# Patient Record
Sex: Female | Born: 1955 | Race: White | Hispanic: No | Marital: Single | Smoking: Never smoker
Health system: Southern US, Community
[De-identification: ages and names within clinical notes are randomized; demographics above are authoritative.]

## PROBLEM LIST (undated history)

## (undated) HISTORY — PX: NECK SURGERY: SHX720

---

## 1998-06-25 ENCOUNTER — Other Ambulatory Visit: Admission: RE | Admit: 1998-06-25 | Discharge: 1998-06-25 | Payer: Self-pay | Admitting: Obstetrics & Gynecology

## 1999-06-30 ENCOUNTER — Other Ambulatory Visit: Admission: RE | Admit: 1999-06-30 | Discharge: 1999-06-30 | Payer: Self-pay | Admitting: Obstetrics & Gynecology

## 1999-10-20 ENCOUNTER — Other Ambulatory Visit: Admission: RE | Admit: 1999-10-20 | Discharge: 1999-10-20 | Payer: Self-pay | Admitting: Obstetrics & Gynecology

## 2000-06-30 ENCOUNTER — Other Ambulatory Visit: Admission: RE | Admit: 2000-06-30 | Discharge: 2000-06-30 | Payer: Self-pay | Admitting: Obstetrics & Gynecology

## 2001-07-05 ENCOUNTER — Other Ambulatory Visit: Admission: RE | Admit: 2001-07-05 | Discharge: 2001-07-05 | Payer: Self-pay | Admitting: Obstetrics and Gynecology

## 2001-07-08 ENCOUNTER — Ambulatory Visit (HOSPITAL_COMMUNITY): Admission: RE | Admit: 2001-07-08 | Discharge: 2001-07-08 | Payer: Self-pay | Admitting: Obstetrics & Gynecology

## 2001-07-08 ENCOUNTER — Encounter: Payer: Self-pay | Admitting: Obstetrics & Gynecology

## 2002-07-11 ENCOUNTER — Other Ambulatory Visit: Admission: RE | Admit: 2002-07-11 | Discharge: 2002-07-11 | Payer: Self-pay | Admitting: Obstetrics & Gynecology

## 2003-07-18 ENCOUNTER — Other Ambulatory Visit: Admission: RE | Admit: 2003-07-18 | Discharge: 2003-07-18 | Payer: Self-pay | Admitting: Obstetrics & Gynecology

## 2004-09-03 ENCOUNTER — Other Ambulatory Visit: Admission: RE | Admit: 2004-09-03 | Discharge: 2004-09-03 | Payer: Self-pay | Admitting: Obstetrics & Gynecology

## 2006-04-13 HISTORY — PX: BREAST LUMPECTOMY: SHX2

## 2007-01-21 ENCOUNTER — Encounter: Admission: RE | Admit: 2007-01-21 | Discharge: 2007-01-21 | Payer: Self-pay | Admitting: General Surgery

## 2007-01-21 ENCOUNTER — Ambulatory Visit (HOSPITAL_BASED_OUTPATIENT_CLINIC_OR_DEPARTMENT_OTHER): Admission: RE | Admit: 2007-01-21 | Discharge: 2007-01-21 | Payer: Self-pay | Admitting: General Surgery

## 2007-01-21 ENCOUNTER — Encounter (INDEPENDENT_AMBULATORY_CARE_PROVIDER_SITE_OTHER): Payer: Self-pay | Admitting: General Surgery

## 2010-01-14 ENCOUNTER — Ambulatory Visit: Payer: Self-pay | Admitting: Internal Medicine

## 2010-08-24 ENCOUNTER — Emergency Department (HOSPITAL_COMMUNITY)
Admission: EM | Admit: 2010-08-24 | Discharge: 2010-08-24 | Disposition: A | Discharge status: Home / Self Care | Payer: 59 | Attending: Emergency Medicine | Admitting: Emergency Medicine

## 2010-08-24 ENCOUNTER — Emergency Department (HOSPITAL_COMMUNITY): Payer: 59

## 2010-08-24 DIAGNOSIS — M542 Cervicalgia: Secondary | ICD-10-CM | POA: Insufficient documentation

## 2010-08-24 DIAGNOSIS — M79609 Pain in unspecified limb: Secondary | ICD-10-CM | POA: Insufficient documentation

## 2010-08-24 DIAGNOSIS — M62838 Other muscle spasm: Secondary | ICD-10-CM | POA: Insufficient documentation

## 2010-08-24 DIAGNOSIS — M25519 Pain in unspecified shoulder: Secondary | ICD-10-CM | POA: Insufficient documentation

## 2010-08-25 ENCOUNTER — Telehealth: Payer: Self-pay | Admitting: *Deleted

## 2010-08-25 NOTE — Telephone Encounter (Signed)
Pt went to ED yesterday morning with Rt shoulder pain that radiates down arm.  Cardiac work-up and X-Rays were normal according to patient.  ED doctor prescribed Hydrocodone 5/325mg  for pain and Methocarbamol 750 mg for muscle spasms.  Pt states she is having no relief with these medications.  Wants to know if she should give it a few days or come in to see you.  Please advise.

## 2010-08-25 NOTE — Telephone Encounter (Signed)
Spoke with patient and she is going to schedule an appointment with an orthopedist to evaluate Rt shoulder pain.

## 2010-08-25 NOTE — Telephone Encounter (Signed)
Have pt call to see orthopedist of her choice. If none, she may call Trudie Reed @ [phone]

## 2010-08-26 NOTE — Op Note (Signed)
NAMEDAMESHA, [name]          ACCOUNT NO.:  [id number]   MEDICAL RECORD NO.:  [id number]          PATIENT TYPE:  AMB   LOCATION:  DSC                          FACILITY:  [facility]   PHYSICIAN:  Gabrielle Dare. Janee Morn, M.D.DATE OF BIRTH:  Sep 25, 1955   DATE OF PROCEDURE:  01/21/2007  DATE OF DISCHARGE:                               OPERATIVE REPORT   PREOPERATIVE DIAGNOSIS:  Left breast mass.   POSTOPERATIVE DIAGNOSIS:  Left breast mass.   PROCEDURE:  Needle-localized left breast biopsy.   SURGEON:  Violeta Gelinas, MD   ANESTHESIA:  General with laryngeal mask airway.   HISTORY OF PRESENT ILLNESS:  Ms. Wahid is a 55 year old female who  underwent image-guided biopsy of a suspicious area on mammogram of her  left breast.  Pathology report demonstrated fibrocystic change with a  foci of atypia, so biopsy was recommended.  She presents today for  elective needle-localized biopsy.   PROCEDURE IN DETAIL:  The patient first went to the breast center for a  needle localization.  She came over to the surgery center with her films  and her needle in place.  She was identified in the preop holding area  and her site was marked.  She received intravenous antibiotics.  She was  brought to the operating room.  Prior to prepping, her localizing needle  was trimmed to about 2 cm exiting from the skin.  After induction of  general anesthesia with laryngeal mask airway by the anesthesia staff,  her left breast was prepped and draped in a sterile fashion.  A  transverse incision was made to include the skin entrance site of the  needle.  Subcutaneous tissues were dissected down.  A core of breast  tissue was dissected out surrounding the needle and according to the  films for guidance, this was circumferentially dissected.  BOVIE cautery  was used to get excellent hemostasis.  The mass was removed in one  piece.  It was oriented with stitches for Pathology and sent for  radiology.  The wound in  the meantime was copiously irrigated.  Hemostasis was ensured.  We were notified that the specimen contained  all the suspicious area of tissue by Dr. Azucena Kuba, the radiologist.  The  wound was then closed; the subcutaneous tissues were approximated with  running 3-0 Vicryl suture and the skin was closed with a running 4-0  Monocryl subcuticular stitch.  Sponge, needle and instrument counts were  correct.  Benzoin, Steri-Strips and sterile dressings were applied.  The  patient tolerated the procedure well without apparent complication and  was taken to the recovery room in stable condition.      Gabrielle Dare Janee Morn, M.D.  Electronically Signed    BET/MEDQ  D:  01/21/2007  T:  01/22/2007  Job:  [id number]   cc:   Phone number [phone] Alice Reichert MD  Gerrit Friends. Aldona Bar, M.D.

## 2010-09-15 ENCOUNTER — Ambulatory Visit (HOSPITAL_COMMUNITY)
Admission: RE | Admit: 2010-09-15 | Discharge: 2010-09-15 | Disposition: A | Discharge status: Home / Self Care | Payer: 59 | Source: Ambulatory Visit | Attending: Orthopedic Surgery | Admitting: Orthopedic Surgery

## 2010-09-15 ENCOUNTER — Other Ambulatory Visit (HOSPITAL_COMMUNITY): Payer: Self-pay | Admitting: Orthopedic Surgery

## 2010-09-15 ENCOUNTER — Encounter (HOSPITAL_COMMUNITY)
Admission: RE | Admit: 2010-09-15 | Discharge: 2010-09-15 | Disposition: A | Discharge status: Home / Self Care | Payer: 59 | Source: Ambulatory Visit | Attending: Orthopedic Surgery | Admitting: Orthopedic Surgery

## 2010-09-15 DIAGNOSIS — Z01818 Encounter for other preprocedural examination: Secondary | ICD-10-CM | POA: Insufficient documentation

## 2010-09-15 DIAGNOSIS — M47812 Spondylosis without myelopathy or radiculopathy, cervical region: Secondary | ICD-10-CM | POA: Insufficient documentation

## 2010-09-15 DIAGNOSIS — M542 Cervicalgia: Secondary | ICD-10-CM

## 2010-09-15 DIAGNOSIS — Z01812 Encounter for preprocedural laboratory examination: Secondary | ICD-10-CM | POA: Insufficient documentation

## 2010-09-15 LAB — CBC
HCT: 40.9 % (ref 36.0–46.0)
MCHC: 32.3 g/dL (ref 30.0–36.0)
Platelets: 235 10*3/uL (ref 150–400)
RDW: 13.7 % (ref 11.5–15.5)

## 2010-09-15 LAB — SURGICAL PCR SCREEN: MRSA, PCR: NEGATIVE

## 2010-09-17 ENCOUNTER — Ambulatory Visit (HOSPITAL_COMMUNITY)
Admission: RE | Admit: 2010-09-17 | Discharge: 2010-09-18 | Disposition: A | Discharge status: Home / Self Care | Payer: 59 | Source: Ambulatory Visit | Attending: Orthopedic Surgery | Admitting: Orthopedic Surgery

## 2010-09-17 ENCOUNTER — Ambulatory Visit (HOSPITAL_COMMUNITY): Payer: 59

## 2010-09-17 DIAGNOSIS — E669 Obesity, unspecified: Secondary | ICD-10-CM | POA: Insufficient documentation

## 2010-09-17 DIAGNOSIS — R42 Dizziness and giddiness: Secondary | ICD-10-CM | POA: Insufficient documentation

## 2010-09-17 DIAGNOSIS — M5030 Other cervical disc degeneration, unspecified cervical region: Secondary | ICD-10-CM | POA: Insufficient documentation

## 2010-09-22 NOTE — Op Note (Signed)
[name], [name]          ACCOUNT NO.:  [id number]  MEDICAL RECORD NO.:  [id number]  LOCATION:  5017                         FACILITY:  [facility]  PHYSICIAN:  Alvy Beal, MD    DATE OF BIRTH:  December 17, 1955  DATE OF PROCEDURE:  09/17/2010 DATE OF DISCHARGE:                              OPERATIVE REPORT   PREOPERATIVE DIAGNOSES: 1. Advanced degenerative cervical disk disease, C5-6. 2. Acute cervical disk herniation, C6-7, left and right side.  POSTOPERATIVE DIAGNOSES: 1. Advanced degenerative cervical disk disease, C5-6. 2. Acute cervical disk herniation, C6-7, left and right side.  OPERATIVE PROCEDURE:  Anterior cervical diskectomy and fusion, C5-6, C6- 7.  COMPLICATIONS:  None.  CONDITION:  Stable.  INSTRUMENTATION SYSTEM USED:  Titan titanium intervertebral graft, 7 medium lordotic at both levels with a Synthes anterior cervical Vector plate and bone graft filler.  COMPLICATIONS:  None.  CONDITION:  Stable.  HISTORY:  This is a very pleasant 55 year old woman who presents with significant acute right arm pain, C7 radicular pain, sensory changes, and weakness.  As a result, an MRI was done which demonstrated the significant degenerative disease at C5-6 and the acute disk herniation at C6-7.  After discussing the risks, benefits, and alternatives, she elected to proceed with surgery.  OPERATIVE NOTE:  The patient was brought to the operating room, placed supine on the operating table.  After successful induction of general anesthesia and endotracheal intubation, TEDs and SCDs were applied. Rolled towels were placed between the shoulder blades.  They were then taped down to allow for radiographic evaluation and the anterior cervical spine was prepped and draped in a standard fashion. Appropriate time-out was done to confirm patient, procedure, affected extremity, and all other pertinent important data.  Once this was done, a transverse incision was made,  centered over the C6 vertebral body.  Sharp dissection was carried down to and through the platysma.  I then bluntly dissected through the deep cervical fascia, sweeping the trachea and esophagus to the right and palpating identifying and protecting the carotid sheath laterally with a finger. At this point, I could palpate the anterior cervical spine.  Thyroid retractor was placed to hold the trachea and esophagus out of the way and I used 2 Kerrison dissectors to mobilize the prevertebral fascia to completely expose the C5-6, C6-7 disk space.  There was significant degenerative changes at C5-6 level which is why elected to include this infusion.  I placed a needle into the 5/6 disk space, took an x-ray, and confirmed that I was at the appropriate level.  Once this was done and I was at the appropriate level, I then placed self-retaining retractors, deflated the endotracheal cuff, expanded the retractors, and reinflated the endotracheal cuff.  I placed distraction pins into the bodies of C6 and C7 and proceeded with diskectomy. An annulotomy was performed with the 15-blade scalpel using a combination of pituitary rongeurs, curettes, and Kerrison rongeurs.  I removed all of the disk material.  Once I had the disk material, I used a fine nerve hook to sweep on the right-hand side and was able to mobilize and remove the disk herniation.  I then resected the posterior longitudinal ligament to expose the overlying  thecal sac.  At this point, I could freely run my nerve hook underneath the endplates and realized I had an adequate decompression.  At this point, I irrigated copiously with normal saline, rasped the endplates until I had bleeding subchondral bone and then sequentially measured.  I placed a 7-medium lordotic cage, tightened, packed with autograft bone, mixed with DBX.  I attempted to use the master grafts as requested by her insurance company, however, it was very difficult to pack  the cage.  With a maintained the graft inside the cage, I elected to use the autograft bone with DBX.  I malleted this into place and had an adequate fix.  I then repositioned the distraction pins into the bodies of C5 and C6 and using the same technique, I performed a similar diskectomy.  Once I had resected all the posterior annulus and released, I then rasped, measured, and placed same size graft at this level.  I then contoured a 34-mm anterior cervical plate and applied at the anterior aspect of the vertebral body.  There was still significant slope to the C5 vertebral body.  Having resected the majority of the osteophyte, in order to properly position the plates, but I was still some distance away from the uninvolved C4-5 disk level and placed 16-mm screws which were got excellent purchase.  I then placed 14-mm screws at 6 and 7 level and all screws were properly torqued down.  X-rays were taken which were satisfactory.  At this point in time, the wound was copiously irrigated with normal saline.  Retractors were removed.  I checked for hemostasis and to ensure the trachea and the esophagus did not become entrapped underneath the plate.  Once this was done, I returned the trachea and esophagus to midline, closed the platysma with interrupted 2-0 Vicryl sutures and 3-0 Monocryl for the skin.  Steri-Strips, dry dressing were applied.  The patient was extubated, transferred to the PACU without incident.  At the end of the case, all needle and sponge counts were correct.     Alvy Beal, MD     DDB/MEDQ  D:  09/17/2010  T:  09/18/2010  Job:  [id number]  cc:   [facility]  Electronically Signed by Venita Lick MD on 09/22/2010 12:03:03 AM

## 2013-11-08 ENCOUNTER — Ambulatory Visit (INDEPENDENT_AMBULATORY_CARE_PROVIDER_SITE_OTHER): Payer: 59 | Admitting: Family Medicine

## 2013-11-08 VITALS — BP 124/86 | HR 119 | Temp 98.5°F | Resp 16 | Ht 66.5 in | Wt 247.4 lb

## 2013-11-08 DIAGNOSIS — J320 Chronic maxillary sinusitis: Secondary | ICD-10-CM

## 2013-11-08 DIAGNOSIS — J209 Acute bronchitis, unspecified: Secondary | ICD-10-CM

## 2013-11-08 MED ORDER — AZITHROMYCIN 250 MG PO TABS: ORAL_TABLET | ORAL | Status: DC

## 2013-11-08 NOTE — Patient Instructions (Signed)
Acute Bronchitis Bronchitis is inflammation of the airways that extend from the windpipe into the lungs (bronchi). The inflammation often causes mucus to develop. This leads to a cough, which is the most common symptom of bronchitis.  In acute bronchitis, the condition usually develops suddenly and goes away over time, usually in a couple weeks. Smoking, allergies, and asthma can make bronchitis worse. Repeated episodes of bronchitis may cause further lung problems.  CAUSES Acute bronchitis is most often caused by the same virus that causes a cold. The virus can spread from person to person (contagious) through coughing, sneezing, and touching contaminated objects. SIGNS AND SYMPTOMS   Cough.   Fever.   Coughing up mucus.   Body aches.   Chest congestion.   Chills.   Shortness of breath.   Sore throat.  DIAGNOSIS  Acute bronchitis is usually diagnosed through a physical exam. Your health care provider will also ask you questions about your medical history. Tests, such as chest X-rays, are sometimes done to rule out other conditions.  TREATMENT  Acute bronchitis usually goes away in a couple weeks. Oftentimes, no medical treatment is necessary. Medicines are sometimes given for relief of fever or cough. Antibiotic medicines are usually not needed but may be prescribed in certain situations. In some cases, an inhaler may be recommended to help reduce shortness of breath and control the cough. A cool mist vaporizer may also be used to help thin bronchial secretions and make it easier to clear the chest.  HOME CARE INSTRUCTIONS  Get plenty of rest.   Drink enough fluids to keep your urine clear or pale yellow (unless you have a medical condition that requires fluid restriction). Increasing fluids may help thin your respiratory secretions (sputum) and reduce chest congestion, and it will prevent dehydration.   Take medicines only as directed by your health care provider.  If  you were prescribed an antibiotic medicine, finish it all even if you start to feel better.  Avoid smoking and secondhand smoke. Exposure to cigarette smoke or irritating chemicals will make bronchitis worse. If you are a smoker, consider using nicotine gum or skin patches to help control withdrawal symptoms. Quitting smoking will help your lungs heal faster.   Reduce the chances of another bout of acute bronchitis by washing your hands frequently, avoiding people with cold symptoms, and trying not to touch your hands to your mouth, nose, or eyes.   Keep all follow-up visits as directed by your health care provider.  SEEK MEDICAL CARE IF: Your symptoms do not improve after 1 week of treatment.  SEEK IMMEDIATE MEDICAL CARE IF:  You develop an increased fever or chills.   You have chest pain.   You have severe shortness of breath.  You have bloody sputum.   You develop dehydration.  You faint or repeatedly feel like you are going to pass out.  You develop repeated vomiting.  You develop a severe headache. MAKE SURE YOU:   Understand these instructions.  Will watch your condition.  Will get help right away if you are not doing well or get worse. Document Released: 05/07/2004 Document Revised: 08/14/2013 Document Reviewed: 09/20/2012 ExitCare Patient Information 2015 [employer]. This information is not intended to replace advice given to you by your health care provider. Make sure you discuss any questions you have with your health care provider. Sinusitis Sinusitis is redness, soreness, and inflammation of the paranasal sinuses. Paranasal sinuses are air pockets within the bones of your face (beneath the   eyes, the middle of the forehead, or above the eyes). In healthy paranasal sinuses, mucus is able to drain out, and air is able to circulate through them by way of your nose. However, when your paranasal sinuses are inflamed, mucus and air can become trapped. This can  allow bacteria and other germs to grow and cause infection. Sinusitis can develop quickly and last only a short time (acute) or continue over a long period (chronic). Sinusitis that lasts for more than 12 weeks is considered chronic.  CAUSES  Causes of sinusitis include:  Allergies.  Structural abnormalities, such as displacement of the cartilage that separates your nostrils (deviated septum), which can decrease the air flow through your nose and sinuses and affect sinus drainage.  Functional abnormalities, such as when the small hairs (cilia) that line your sinuses and help remove mucus do not work properly or are not present. SIGNS AND SYMPTOMS  Symptoms of acute and chronic sinusitis are the same. The primary symptoms are pain and pressure around the affected sinuses. Other symptoms include:  Upper toothache.  Earache.  Headache.  Bad breath.  Decreased sense of smell and taste.  A cough, which worsens when you are lying flat.  Fatigue.  Fever.  Thick drainage from your nose, which often is green and may contain pus (purulent).  Swelling and warmth over the affected sinuses. DIAGNOSIS  Your health care provider will perform a physical exam. During the exam, your health care provider may:  Look in your nose for signs of abnormal growths in your nostrils (nasal polyps).  Tap over the affected sinus to check for signs of infection.  View the inside of your sinuses (endoscopy) using an imaging device that has a light attached (endoscope). If your health care provider suspects that you have chronic sinusitis, one or more of the following tests may be recommended:  Allergy tests.  Nasal culture. A sample of mucus is taken from your nose, sent to a lab, and screened for bacteria.  Nasal cytology. A sample of mucus is taken from your nose and examined by your health care provider to determine if your sinusitis is related to an allergy. TREATMENT  Most cases of acute  sinusitis are related to a viral infection and will resolve on their own within 10 days. Sometimes medicines are prescribed to help relieve symptoms (pain medicine, decongestants, nasal steroid sprays, or saline sprays).  However, for sinusitis related to a bacterial infection, your health care provider will prescribe antibiotic medicines. These are medicines that will help kill the bacteria causing the infection.  Rarely, sinusitis is caused by a fungal infection. In theses cases, your health care provider will prescribe antifungal medicine. For some cases of chronic sinusitis, surgery is needed. Generally, these are cases in which sinusitis recurs more than 3 times per year, despite other treatments. HOME CARE INSTRUCTIONS   Drink plenty of water. Water helps thin the mucus so your sinuses can drain more easily.  Use a humidifier.  Inhale steam 3 to 4 times a day (for example, sit in the bathroom with the shower running).  Apply a warm, moist washcloth to your face 3 to 4 times a day, or as directed by your health care provider.  Use saline nasal sprays to help moisten and clean your sinuses.  Take medicines only as directed by your health care provider.  If you were prescribed either an antibiotic or antifungal medicine, finish it all even if you start to feel better. SEEK IMMEDIATE MEDICAL   CARE IF:  You have increasing pain or severe headaches.  You have nausea, vomiting, or drowsiness.  You have swelling around your face.  You have vision problems.  You have a stiff neck.  You have difficulty breathing. MAKE SURE YOU:   Understand these instructions.  Will watch your condition.  Will get help right away if you are not doing well or get worse. Document Released: 03/30/2005 Document Revised: 08/14/2013 Document Reviewed: 04/14/2011 ExitCare Patient Information 2015 [employer]. This information is not intended to replace advice given to you by your health care provider.  Make sure you discuss any questions you have with your health care provider.  

## 2013-11-08 NOTE — Progress Notes (Signed)
Patient ID: Dolan AmenVirginia L Powell MRN: [id number], DOB: 04/22/1955, 58 y.o. Date of Encounter: 11/08/2013, 9:06 AM  Primary Physician: PROVIDER NOT IN SYSTEM  Chief Complaint:  Chief Complaint  Patient presents with  . URI    Since last Friday, went to [facility] and received medicine but it is not bette, coughing, sore throat    HPI: 58 y.o. year old female presents with 5 day history of nasal congestion, post nasal drip, sore throat, sinus pressure, and cough. Afebrile. No chills. Nasal congestion thick and green/yellow. Sinus pressure is the worst symptom. Cough is productive secondary to post nasal drip and not associated with time of day. Ears feel full, leading to sensation of muffled hearing. Has tried OTC cold preps without success. No GI complaints.   No recent antibiotics, recent travels, vomiting, or sick contacts   No leg trauma, sedentary periods, h/o cancer, or tobacco use.  History reviewed. No pertinent past medical history.   Home Meds: Prior to Admission medications   Medication Sig Start Date End Date Taking? Authorizing Provider  azithromycin (ZITHROMAX Z-PAK) 250 MG tablet Take as directed on pack 11/08/13   Elvina SidleKurt Breannah Kratt, MD    Allergies: Not on File  History   Social History  . Marital Status: Divorced    Spouse Name: N/A    Number of Children: N/A  . Years of Education: N/A   Occupational History  . Not on file.   Social History Main Topics  . Smoking status: Never Smoker   . Smokeless tobacco: Not on file  . Alcohol Use: No  . Drug Use: No  . Sexual Activity: Not on file   Other Topics Concern  . Not on file   Social History Narrative  . No narrative on file     Review of Systems: Constitutional: negative for chills, fever, night sweats or weight changes Cardiovascular: negative for chest pain or palpitations Respiratory: negative for hemoptysis, wheezing, or shortness of breath Abdominal: negative for abdominal pain, nausea,  vomiting or diarrhea Dermatological: negative for rash Neurologic: negative for headache   Physical Exam: Blood pressure 124/86, pulse 119, temperature 98.5 F (36.9 C), resp. rate 16, height 5' 6.5" (1.689 m), weight 247 lb 6.4 oz (112.22 kg), SpO2 95.00%., Body mass index is 39.34 kg/(m^2). General: Well developed, well nourished, in no acute distress. Head: Normocephalic, atraumatic, eyes without discharge, sclera non-icteric, nares are congested. Bilateral auditory canals clear, TM's are without perforation, pearly grey on right with red left malleus and with reflective cone of light bilaterally. Serous effusion bilaterally behind TM's. Maxillary sinus TTP. Oral cavity moist, dentition normal. Posterior pharynx with post nasal drip and mild erythema. No peritonsillar abscess or tonsillar exudate. Neck: Supple. No thyromegaly. Full ROM. No lymphadenopathy. Lungs: Clear bilaterally to auscultation without wheezes, rales, or rhonchi. Breathing is unlabored.  Heart: RRR with S1 S2. No murmurs, rubs, or gallops appreciated. Msk:  Strength and tone normal for age. Extremities: No clubbing or cyanosis. No edema. Neuro: Alert and oriented X 3. Moves all extremities spontaneously. CNII-XII grossly in tact. Psych:  Responds to questions appropriately with a normal affect.   Labs:   ASSESSMENT AND PLAN:  58 y.o. year old female with sinusitis Maxillary sinusitis, unspecified chronicity - Plan: azithromycin (ZITHROMAX Z-PAK) 250 MG tablet  Acute bronchitis, unspecified organism - Plan: azithromycin (ZITHROMAX Z-PAK) 250 MG tablet   -  -Tylenol/Motrin prn -Rest/fluids -RTC precautions -RTC 3-5 days if no improvement  Signed, Elvina SidleKurt Elvis Laufer, MD 11/08/2013 9:06 AM

## 2015-09-11 ENCOUNTER — Ambulatory Visit (INDEPENDENT_AMBULATORY_CARE_PROVIDER_SITE_OTHER): Payer: 59 | Admitting: Family Medicine

## 2015-09-11 VITALS — BP 126/80 | HR 100 | Temp 97.6°F | Resp 18 | Ht 66.5 in | Wt 256.0 lb

## 2015-09-11 DIAGNOSIS — H8111 Benign paroxysmal vertigo, right ear: Secondary | ICD-10-CM

## 2015-09-11 MED ORDER — MECLIZINE HCL 25 MG PO TABS: 25.0000 mg | ORAL_TABLET | Freq: Three times a day (TID) | ORAL | Status: AC | PRN

## 2015-09-11 MED ORDER — ALPRAZOLAM 0.5 MG PO TABS: 0.2500 mg | ORAL_TABLET | Freq: Two times a day (BID) | ORAL | Status: AC | PRN

## 2015-09-11 NOTE — Progress Notes (Signed)
   HPI  Patient presents today here with her typical vertigo attack  Pt describes room spinning sensation that started this am when she woke up. Symptoms are worse with quick rightward gaze. She has no weakness, numbness, or tingling.   She usually takes xanax fo rthis, her 1 remaining pill this am was very old so she isnt sure if that is why it did not work.   She has been seen by ENT for this an given a handout for epleys maneuvers but never tried them.   She is improved slightly from this am but is still very hesistant to test it in clinic  PMH: Smoking status noted ROS: Per HPI  Objective: BP 126/80 mmHg  Pulse 100  Temp(Src) 97.6 F (36.4 C) (Oral)  Resp 18  Ht 5' 6.5" (1.689 m)  Wt 256 lb (116.121 kg)  BMI 40.71 kg/m2  SpO2 96% Gen: NAD, alert, cooperative with exam HEENT: NCAT, EOMI, PERRL, TMS WNL BL CV: RRR, good S1/S2, no murmur Resp: CTABL, no wheezes, non-labored Ext: No edema, warm Neuro: Alert and oriented, strength 5/5 and sensation intact in BL upper and lower extremities, CN 2-12 intact.   Assessment and plan:  # BPPV History consistent with BPPV, neuro exam reassuring  Given small amount of xanax, also discussed mecliine Discussed epleyes maneuvers in depth, video reviewed and handout reviewed,.  RTC with any worsening or failure to improve.     Meds ordered this encounter  Medications  . ALPRAZolam (XANAX) 0.5 MG tablet    Sig: Take 0.5-1 tablets (0.25-0.5 mg total) by mouth 2 (two) times daily as needed (dizziness).    Dispense:  10 tablet    Refill:  0  . meclizine (ANTIVERT) 25 MG tablet    Sig: Take 1 tablet (25 mg total) by mouth 3 (three) times daily as needed for dizziness.    Dispense:  30 tablet    Refill:  0    Kevin FentonSamuel Laylynn Campanella, MD 2:22 PM

## 2015-09-11 NOTE — Patient Instructions (Addendum)
Great to meet you!  Definitely try the exercises, these will be very helpful for you.   Try meclizine as well, this may help the dizziness without making you as sleepy. It can cause sleepiness.    IF you received an x-ray today, you will receive an invoice from [facility]. Please contact [facility] at [phone] with questions or concerns regarding your invoice.   IF you received labwork today, you will receive an invoice from [employer]. Please contact [facility] at [phone] with questions or concerns regarding your invoice.   Our billing staff will not be able to assist you with questions regarding bills from these companies.  You will be contacted with the lab results as soon as they are available. The fastest way to get your results is to activate your My Chart account. Instructions are located on the last page of this paperwork. If you have not heard from [place] regarding the results in 2 weeks, please contact this office.     We recommend that you schedule a mammogram for breast cancer screening. Typically, you do not need a referral to do this. Please contact a local imaging center to schedule your mammogram.  [facility] - [phone]  *ask for the Radiology Department [facility]) - [phone] or [phone]  [facility] - [phone] [facility] - [phone] [facility] - [phone]  *ask for the Radiology Department [facility] - [phone]  *ask for the Radiology Department [facility] - [phone]  *ask for the Mammography Department [facility] - [phone]   Benign Positional Vertigo Vertigo is the feeling that you or your surroundings are moving when they are not. Benign positional vertigo is the most common form of vertigo. The cause of this condition is not serious (is benign). This condition is  triggered by certain movements and positions (is positional). This condition can be dangerous if it occurs while you are doing something that could endanger you or others, such as driving.  CAUSES In many cases, the cause of this condition is not known. It may be caused by a disturbance in an area of the inner ear that helps your brain to sense movement and balance. This disturbance can be caused by a viral infection (labyrinthitis), head injury, or repetitive motion. RISK FACTORS This condition is more likely to develop in:  Women.  People who are 75 years of age or older. SYMPTOMS Symptoms of this condition usually happen when you move your head or your eyes in different directions. Symptoms may start suddenly, and they usually last for less than a minute. Symptoms may include:  Loss of balance and falling.  Feeling like you are spinning or moving.  Feeling like your surroundings are spinning or moving.  Nausea and vomiting.  Blurred vision.  Dizziness.  Involuntary eye movement (nystagmus). Symptoms can be mild and cause only slight annoyance, or they can be severe and interfere with daily life. Episodes of benign positional vertigo may return (recur) over time, and they may be triggered by certain movements. Symptoms may improve over time. DIAGNOSIS This condition is usually diagnosed by medical history and a physical exam of the head, neck, and ears. You may be referred to a health care provider who specializes in ear, nose, and throat (ENT) problems (otolaryngologist) or a provider who specializes in disorders of the nervous system (neurologist). You may have additional testing, including:  MRI.  A CT scan.  Eye movement tests. Your health care provider may ask you to change positions quickly while he or she watches you for symptoms of benign positional vertigo, such as nystagmus. Eye movement may be tested with an electronystagmogram (ENG), caloric stimulation, the  Dix-Hallpike test, or the roll test.  An electroencephalogram (EEG). This records electrical activity in your brain.  Hearing tests. TREATMENT Usually, your health care provider will treat this by moving your head in specific positions to adjust your inner ear back to normal. Surgery may be needed in severe cases, but this is rare. In some cases, benign positional vertigo may resolve on its own in 2-4 weeks. HOME CARE INSTRUCTIONS Safety  Move slowly.Avoid sudden body or head movements.  Avoid driving.  Avoid operating heavy machinery.  Avoid doing any tasks that would be dangerous to you or others if a vertigo episode would occur.  If you have trouble walking or keeping your balance, try using a cane for stability. If you feel dizzy or unstable, sit down right away.  Return to your normal activities as told by your health care provider. Ask your health care provider what activities are safe for you. General Instructions  Take over-the-counter and prescription medicines only as told by your health care provider.  Avoid certain positions or movements as told by your health care provider.  Drink enough fluid to keep your urine clear or pale yellow.  Keep all follow-up visits as told by your health care provider. This is important. SEEK MEDICAL CARE IF:  You have a fever.  Your condition gets worse or you develop new symptoms.  Your family or friends notice any behavioral changes.  Your nausea or vomiting gets worse.  You have numbness or a "pins and needles" sensation. SEEK IMMEDIATE MEDICAL CARE IF:  You have difficulty speaking or moving.  You are always dizzy.  You faint.  You develop severe headaches.  You have weakness in your legs or arms.  You have changes in your hearing or vision.  You develop a stiff neck.  You develop sensitivity to light.   This information is not intended to replace advice given to you by your health care provider. Make sure you  discuss any questions you have with your health care provider.   Document Released: 01/05/2006 Document Revised: 12/19/2014 Document Reviewed: 07/23/2014 Elsevier Interactive Patient Education [employer]2016 [employer]

## 2020-01-16 ENCOUNTER — Other Ambulatory Visit: Payer: Self-pay | Admitting: Obstetrics & Gynecology

## 2020-01-16 DIAGNOSIS — N63 Unspecified lump in unspecified breast: Secondary | ICD-10-CM

## 2020-01-18 ENCOUNTER — Other Ambulatory Visit: Payer: Self-pay

## 2020-01-18 ENCOUNTER — Other Ambulatory Visit: Payer: Self-pay | Admitting: Obstetrics & Gynecology

## 2020-01-18 ENCOUNTER — Ambulatory Visit
Admission: RE | Admit: 2020-01-18 | Discharge: 2020-01-18 | Disposition: A | Discharge status: Home / Self Care | Payer: Self-pay | Source: Ambulatory Visit | Attending: Obstetrics & Gynecology | Admitting: Obstetrics & Gynecology

## 2020-01-18 DIAGNOSIS — N63 Unspecified lump in unspecified breast: Secondary | ICD-10-CM

## 2020-04-22 ENCOUNTER — Other Ambulatory Visit: Payer: Self-pay

## 2020-06-19 ENCOUNTER — Other Ambulatory Visit: Payer: Self-pay

## 2020-06-19 ENCOUNTER — Ambulatory Visit
Admission: RE | Admit: 2020-06-19 | Discharge: 2020-06-19 | Disposition: A | Discharge status: Home / Self Care | Payer: Medicare Other | Source: Ambulatory Visit | Attending: Obstetrics & Gynecology | Admitting: Obstetrics & Gynecology

## 2020-06-19 DIAGNOSIS — N63 Unspecified lump in unspecified breast: Secondary | ICD-10-CM

## 2021-07-05 IMAGING — US US BREAST*L* LIMITED INC AXILLA
1 series · 6 of 6 positions shown · non-contrast
Comparison: Previous exam(s).

ACR Breast Density Category a: The breast tissue is almost entirely
fatty.

CLINICAL DATA: Palpable abnormality in the LEFT breast, first noted
approximately 2 weeks ago. Mass was initially associated with
visible bruise. The bruise has resolved but the mass remains.
Patient recalls no trauma.

EXAM:
DIGITAL DIAGNOSTIC BILATERAL MAMMOGRAM WITH CAD AND TOMO
ULTRASOUND LEFT BREAST

[Series 1: us breast*left* limited inc axilla · 0.06mm/px · 6 of 6 slices shown]
[im 1/6]
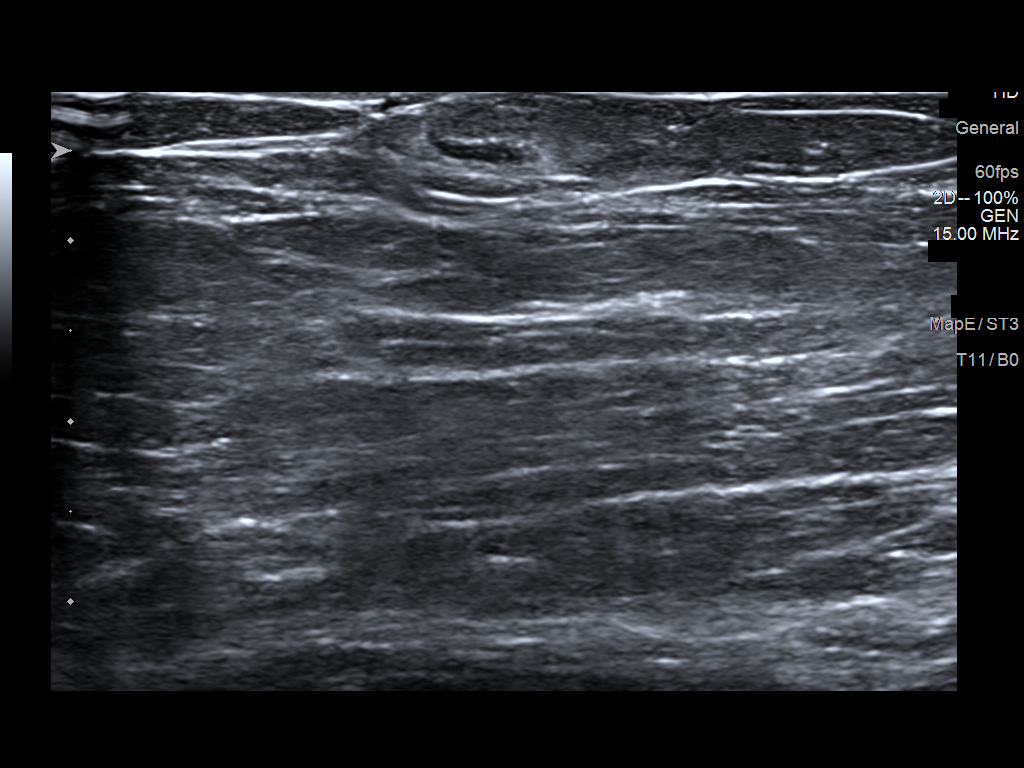
[im 2/6]
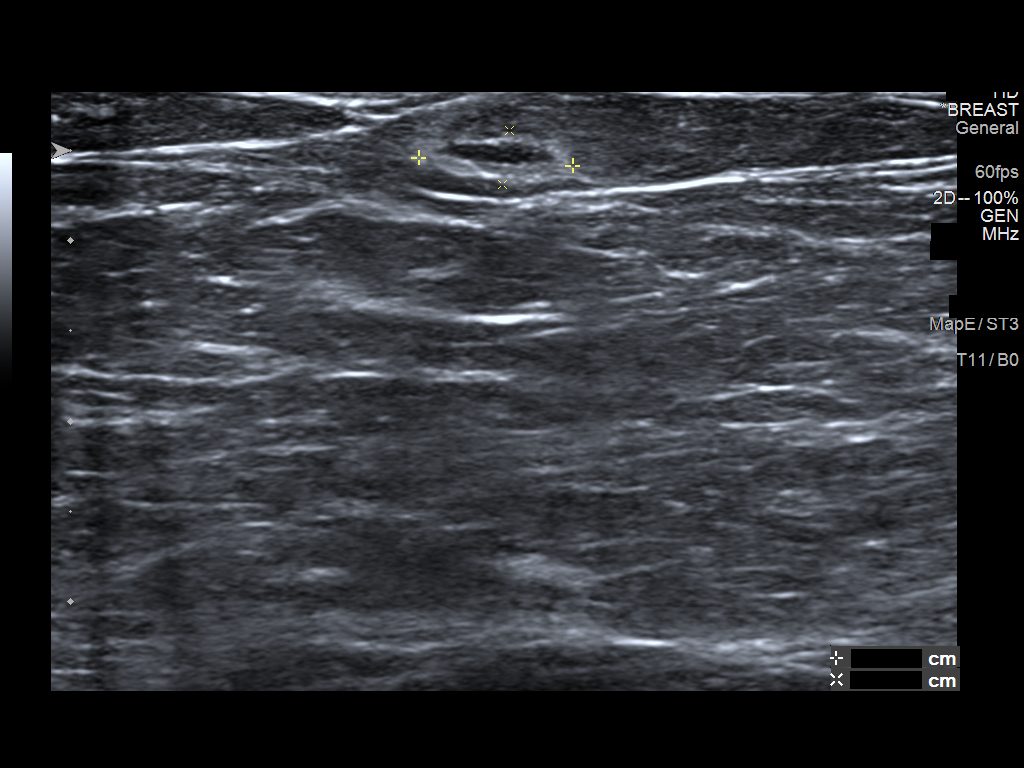
[im 3/6]
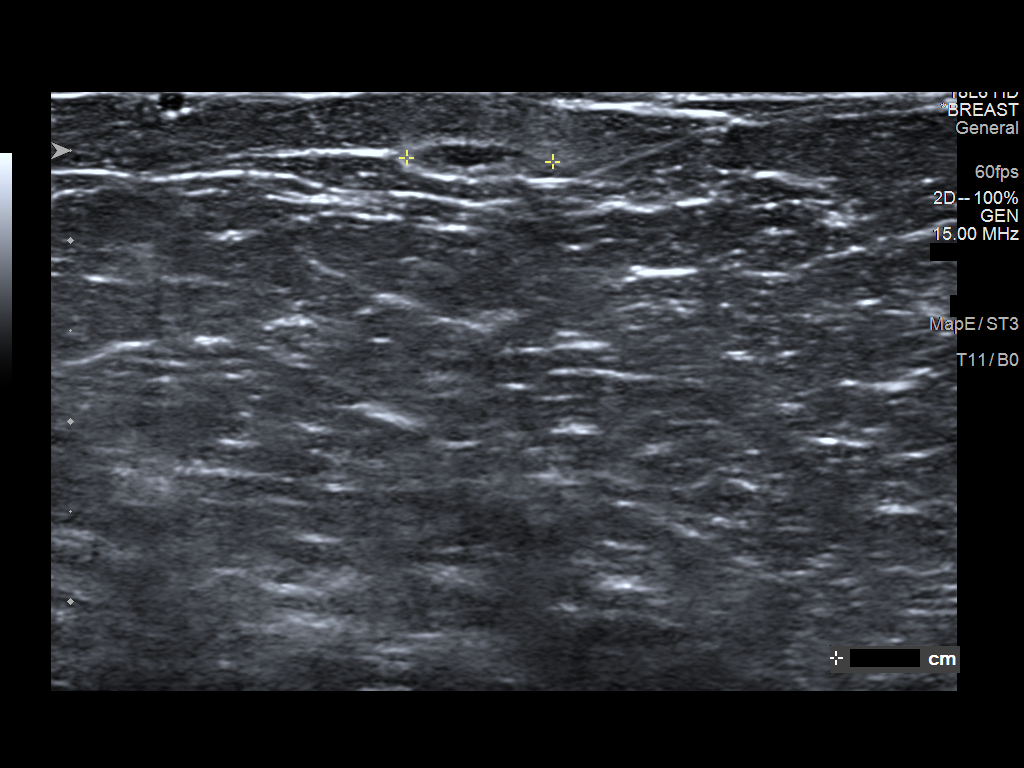
[im 4/6]
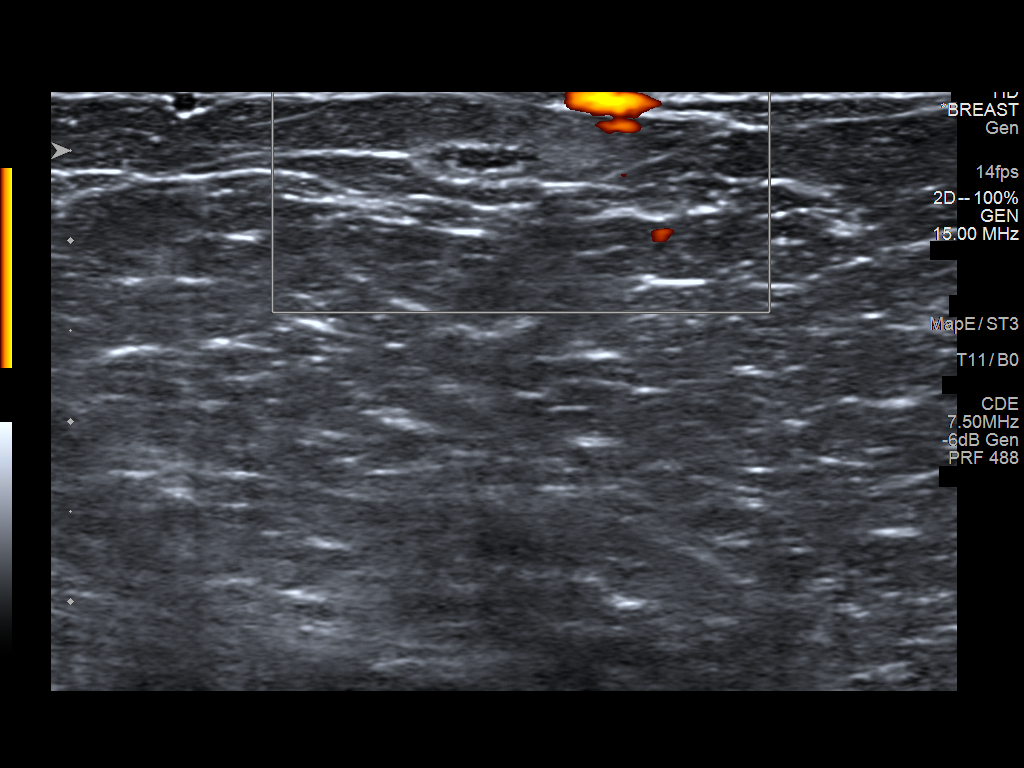
[im 5/6]
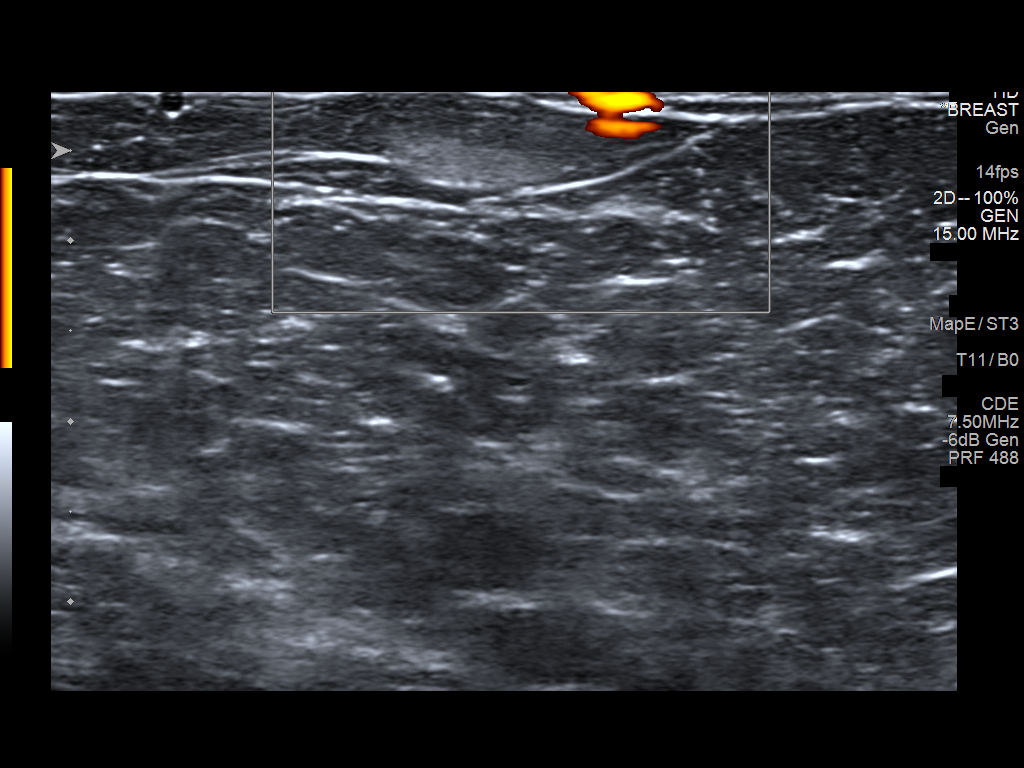
[im 6/6]
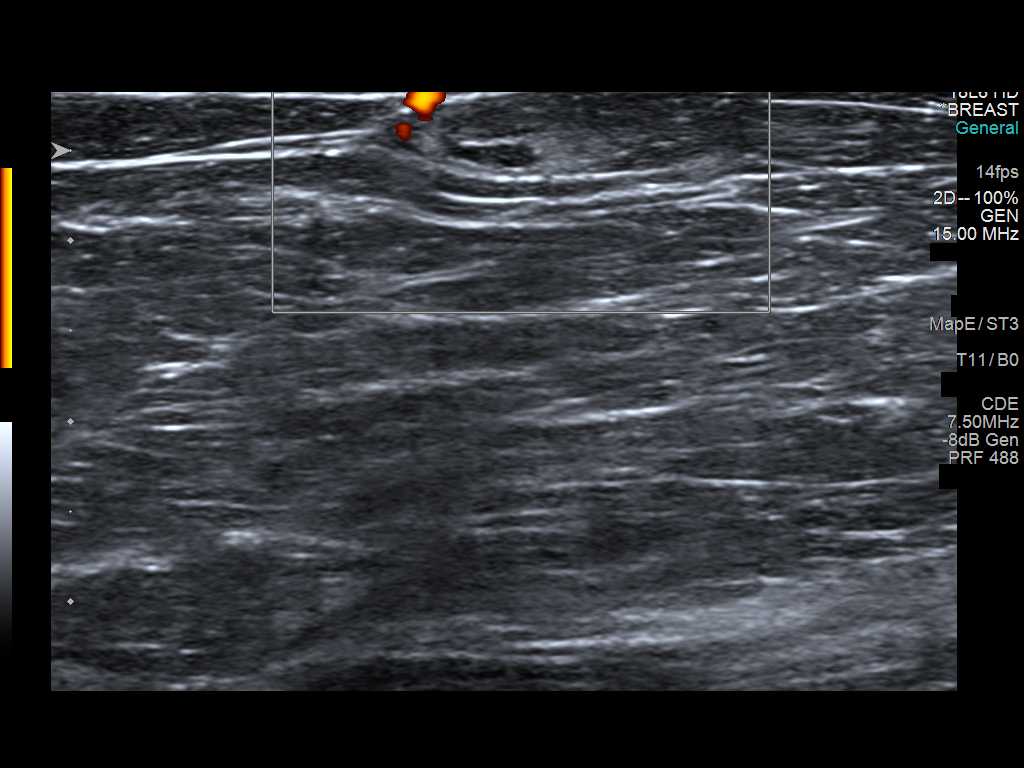

[6 of 6 positions shown; findings below may reference images not displayed]

FINDINGS: BB marks area of patient's concern and corresponds to a superficial
oval mass associated with fat lucency. Mass measures approximately 7
millimeters in diameter.

RIGHT breast is negative.

Mammographic images were processed with CAD.

On physical exam, I palpate a superficial discrete round mass in the
10 o'clock location of the LEFT breast 5 centimeters from the
nipple, in the area of concern. There is no visible ecchymosis.

Targeted ultrasound is performed, showing a mixed echogenicity
lesion in the 10 o'clock location of the LEFT breast 5 centimeters
from the nipple which measures 0.9 x 0.3 x 0.8 centimeters. No
internal blood flow identified by Doppler evaluation.
IMPRESSION: Findings are consistent benign fat necrosis in the LEFT breast. No
mammographic or ultrasound evidence for malignancy.

RECOMMENDATION:
Recommend LEFT breast ultrasound in 3 months to assess
stability/resolution of these changes.

I have discussed the findings and recommendations with the patient.
If applicable, a reminder letter will be sent to the patient
regarding the next appointment.

BI-RADS CATEGORY  3: Probably benign.

## 2021-07-05 IMAGING — MG DIGITAL DIAGNOSTIC BILAT W/ TOMO W/ CAD
6 of 10 series · 6 of 30 positions shown · non-contrast
Comparison: Previous exam(s).

ACR Breast Density Category a: The breast tissue is almost entirely
fatty.

CLINICAL DATA: Palpable abnormality in the LEFT breast, first noted
approximately 2 weeks ago. Mass was initially associated with
visible bruise. The bruise has resolved but the mass remains.
Patient recalls no trauma.

EXAM:
DIGITAL DIAGNOSTIC BILATERAL MAMMOGRAM WITH CAD AND TOMO
ULTRASOUND LEFT BREAST

[R CC synth-2D]
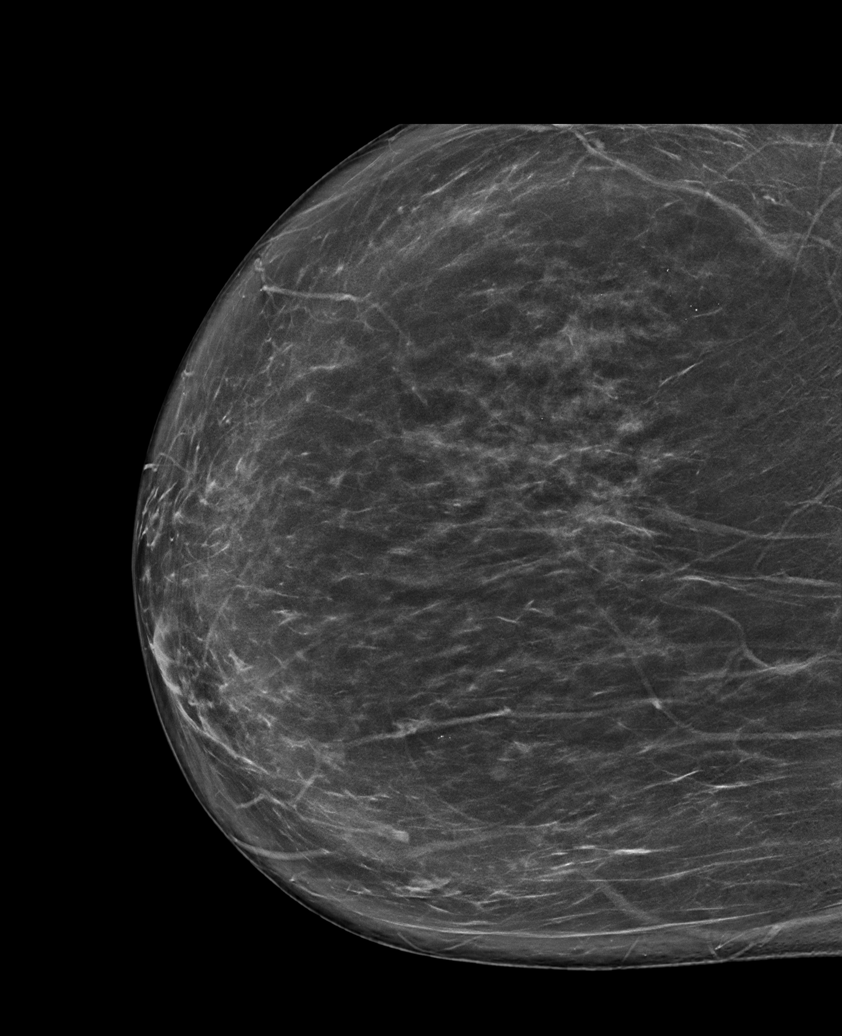

[L MLO synth-2D]
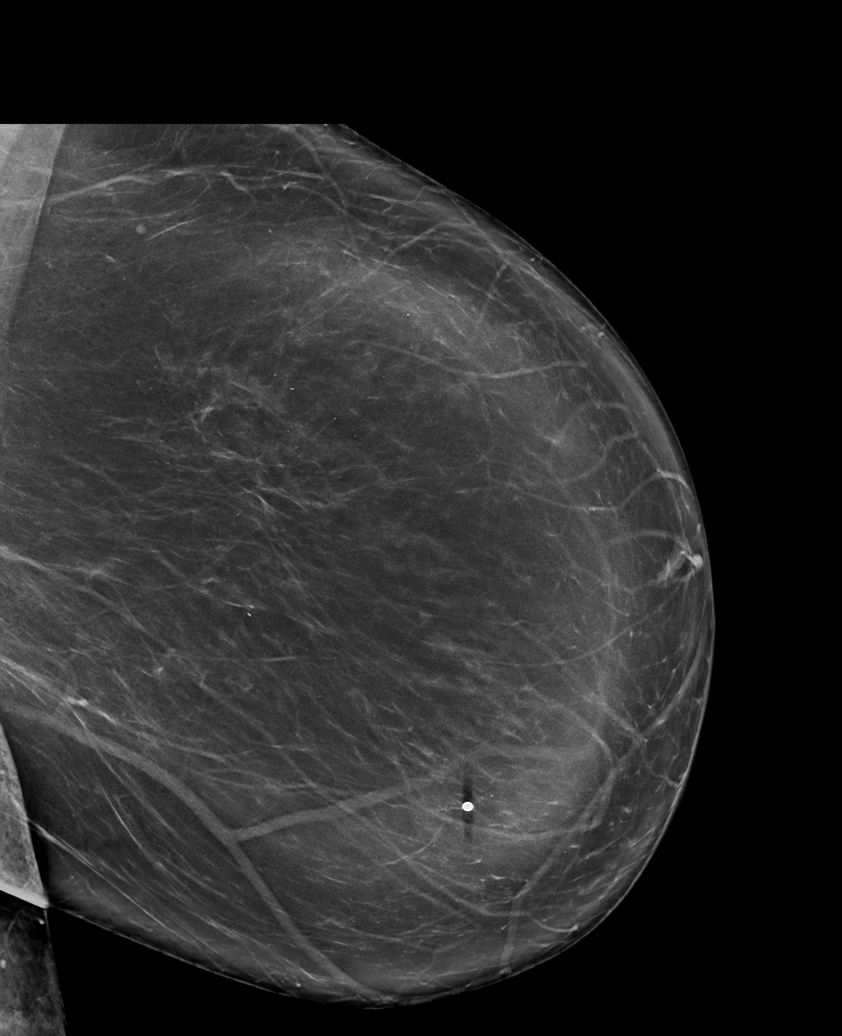

[R MLO synth-2D]
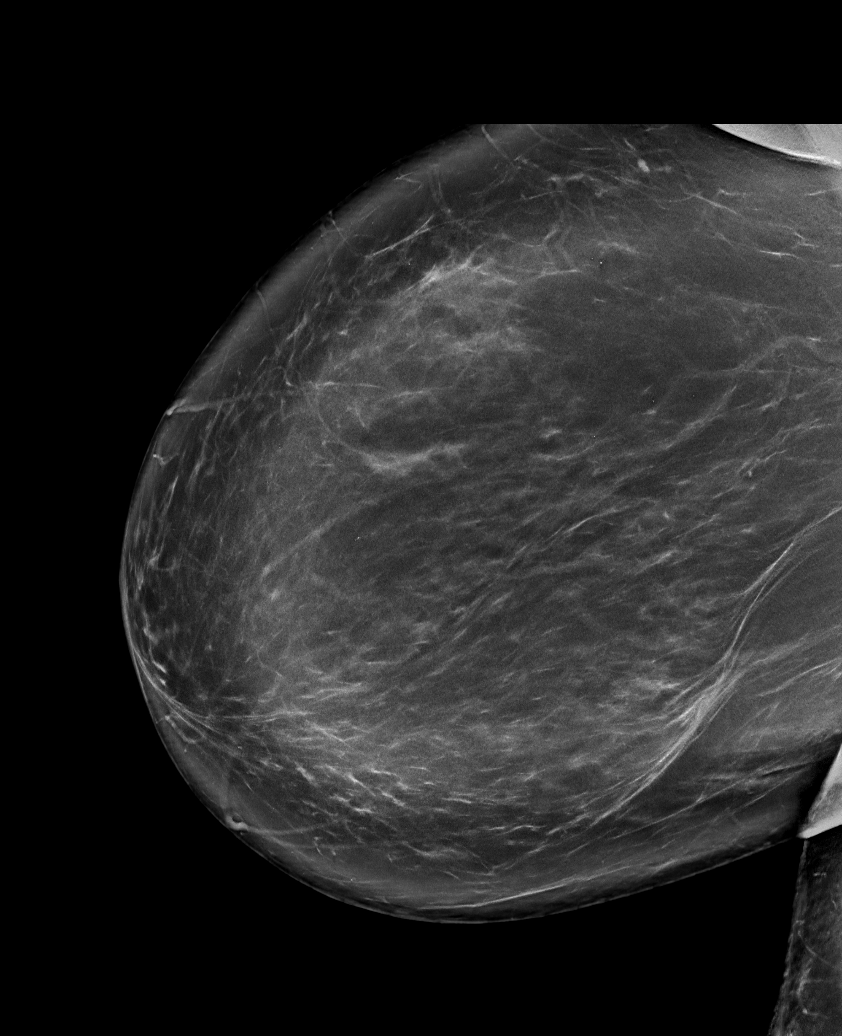

[L TAN synth-2D]
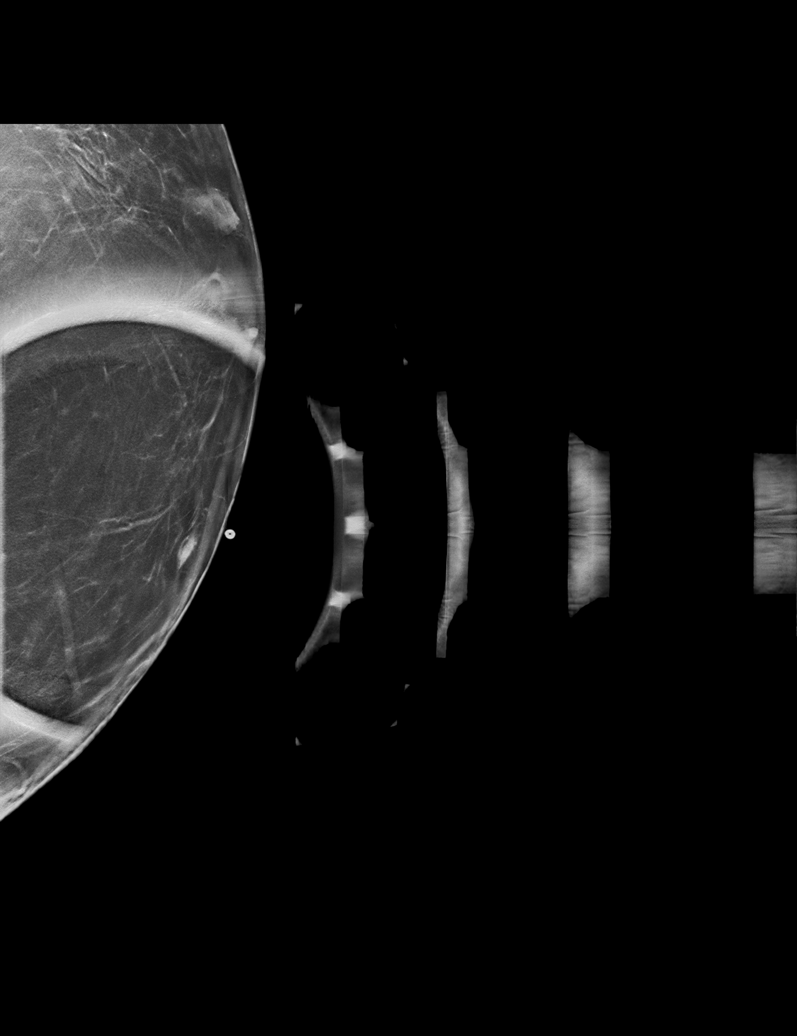

[L CC synth-2D]
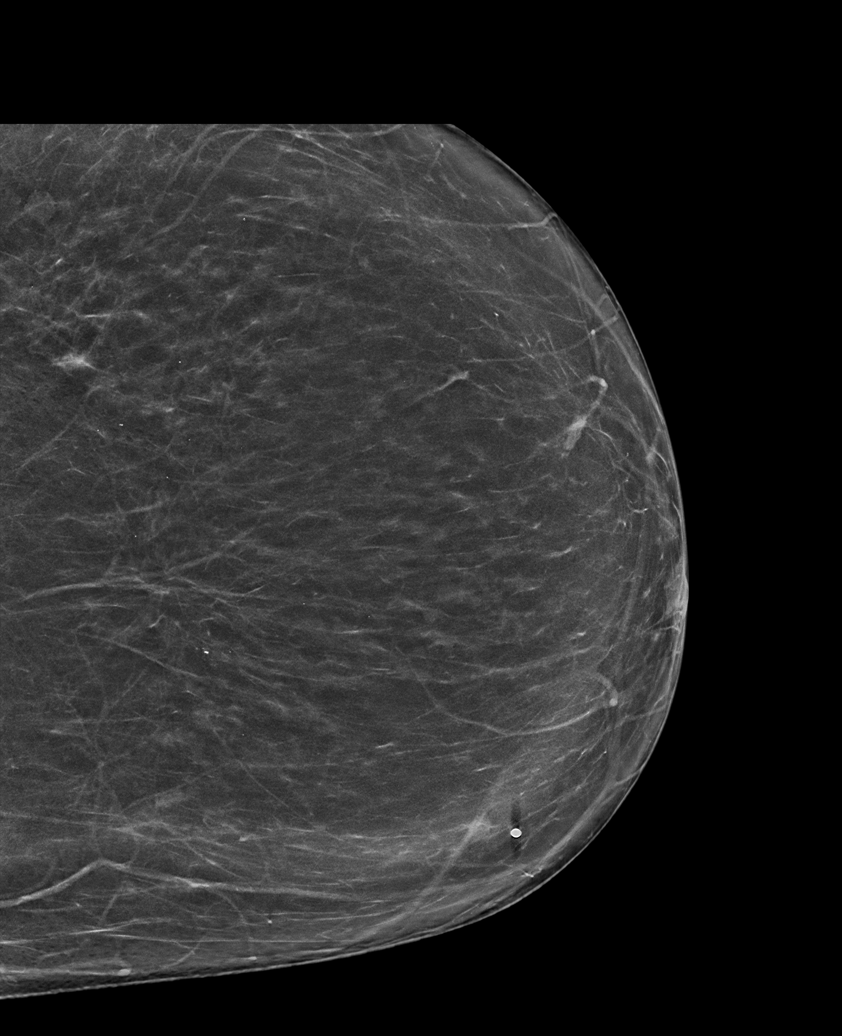

[L TAN tomo · tomo slice 32/63.0]
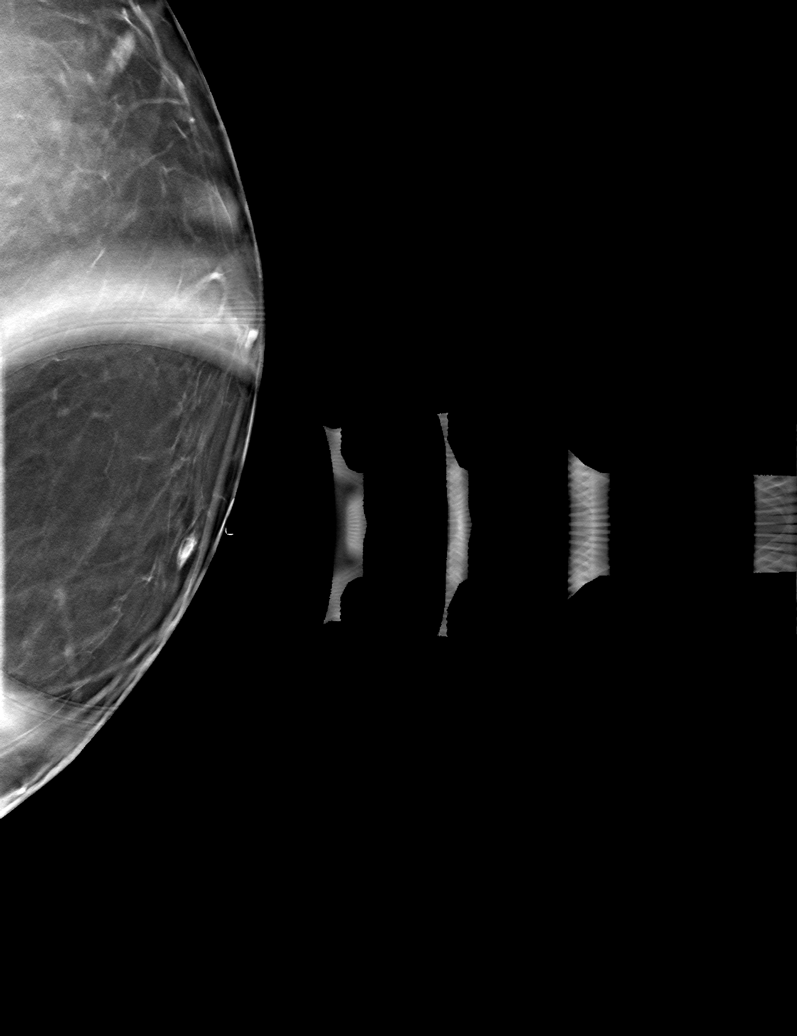

[6 of 30 positions shown; findings below may reference images not displayed]

FINDINGS: BB marks area of patient's concern and corresponds to a superficial
oval mass associated with fat lucency. Mass measures approximately 7
millimeters in diameter.

RIGHT breast is negative.

Mammographic images were processed with CAD.

On physical exam, I palpate a superficial discrete round mass in the
10 o'clock location of the LEFT breast 5 centimeters from the
nipple, in the area of concern. There is no visible ecchymosis.

Targeted ultrasound is performed, showing a mixed echogenicity
lesion in the 10 o'clock location of the LEFT breast 5 centimeters
from the nipple which measures 0.9 x 0.3 x 0.8 centimeters. No
internal blood flow identified by Doppler evaluation.
IMPRESSION: Findings are consistent benign fat necrosis in the LEFT breast. No
mammographic or ultrasound evidence for malignancy.

RECOMMENDATION:
Recommend LEFT breast ultrasound in 3 months to assess
stability/resolution of these changes.

I have discussed the findings and recommendations with the patient.
If applicable, a reminder letter will be sent to the patient
regarding the next appointment.

BI-RADS CATEGORY  3: Probably benign.

## 2021-11-28 DIAGNOSIS — R7303 Prediabetes: Secondary | ICD-10-CM | POA: Diagnosis not present

## 2021-11-28 DIAGNOSIS — Z0000 Encounter for general adult medical examination without abnormal findings: Secondary | ICD-10-CM | POA: Diagnosis not present

## 2021-11-28 DIAGNOSIS — E559 Vitamin D deficiency, unspecified: Secondary | ICD-10-CM | POA: Diagnosis not present

## 2021-11-28 DIAGNOSIS — R42 Dizziness and giddiness: Secondary | ICD-10-CM | POA: Diagnosis not present

## 2021-11-28 DIAGNOSIS — E785 Hyperlipidemia, unspecified: Secondary | ICD-10-CM | POA: Diagnosis not present

## 2021-11-28 DIAGNOSIS — Z1211 Encounter for screening for malignant neoplasm of colon: Secondary | ICD-10-CM | POA: Diagnosis not present

## 2021-12-02 ENCOUNTER — Other Ambulatory Visit: Payer: Self-pay | Admitting: Family Medicine

## 2021-12-02 DIAGNOSIS — E2839 Other primary ovarian failure: Secondary | ICD-10-CM

## 2021-12-03 ENCOUNTER — Other Ambulatory Visit: Payer: Medicare Other

## 2021-12-05 IMAGING — US US BREAST*L* LIMITED INC AXILLA
1 series · 2 of 2 positions shown · non-contrast
Comparison: Previous exams including LEFT breast diagnostic
mammogram and ultrasound dated 01/18/2020.

CLINICAL DATA: Follow-up for probably benign fat necrosis within
the LEFT breast.

EXAM:
ULTRASOUND OF THE LEFT BREAST

[Series 1: us breast*left* limited inc axilla · 0.07mm/px · 2 of 2 slices shown]
[im 1/2]
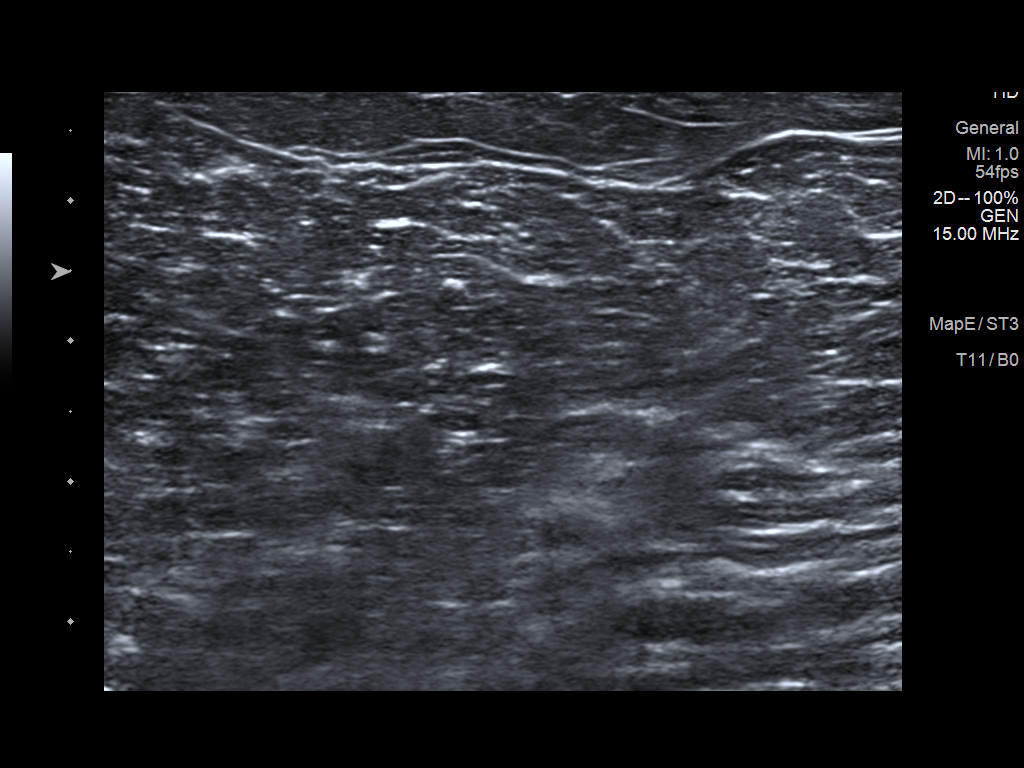
[im 2/2]
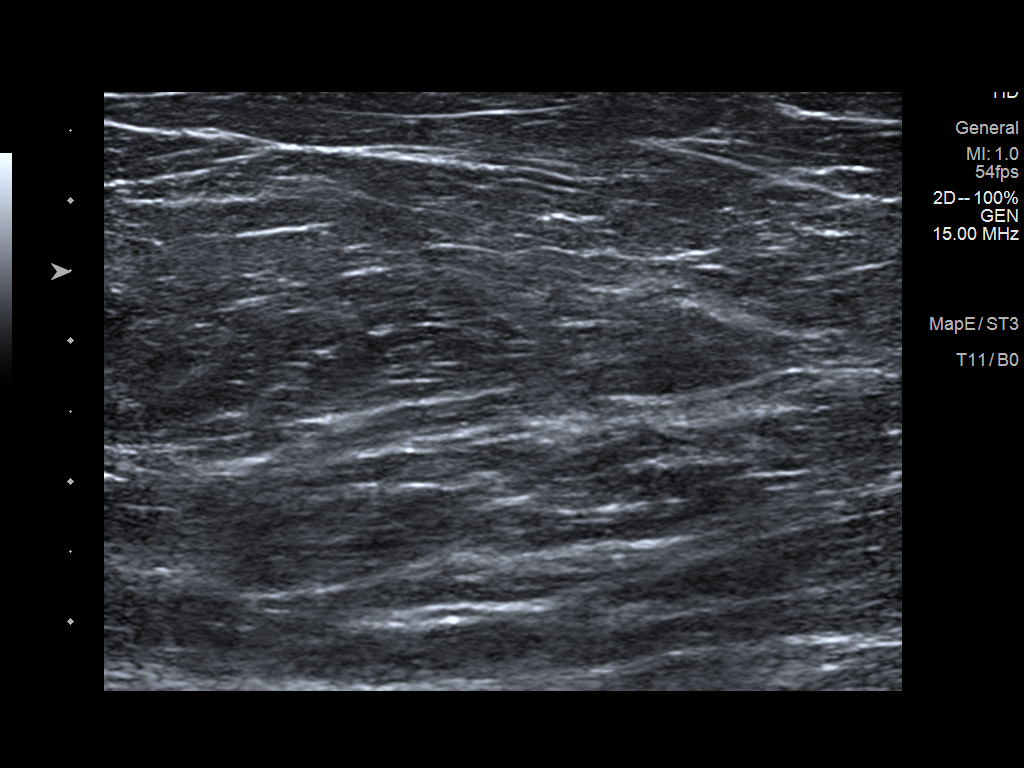

[2 of 2 positions shown; findings below may reference images not displayed]

FINDINGS: Targeted ultrasound is performed, showing an interval resolution of
the previously demonstrated LEFT breast mass, confirming benign fat
necrosis.
IMPRESSION: No evidence of malignancy. Resolved fat necrosis within the LEFT
breast.

Patient may now return to a routine annual bilateral screening
mammogram schedule. Next bilateral screening mammogram will be due
in Monday January, 2021.

RECOMMENDATION:
Bilateral screening mammogram in Monday January, 2021.

I have discussed the findings and recommendations with the patient.
If applicable, a reminder letter will be sent to the patient
regarding the next appointment.

BI-RADS CATEGORY  1: Negative.

## 2021-12-16 DIAGNOSIS — Z1211 Encounter for screening for malignant neoplasm of colon: Secondary | ICD-10-CM | POA: Diagnosis not present

## 2021-12-26 ENCOUNTER — Other Ambulatory Visit: Payer: Self-pay | Admitting: Family Medicine

## 2021-12-26 DIAGNOSIS — Z1231 Encounter for screening mammogram for malignant neoplasm of breast: Secondary | ICD-10-CM

## 2022-03-04 DIAGNOSIS — E785 Hyperlipidemia, unspecified: Secondary | ICD-10-CM | POA: Diagnosis not present

## 2022-05-18 ENCOUNTER — Ambulatory Visit
Admission: RE | Admit: 2022-05-18 | Discharge: 2022-05-18 | Disposition: A | Discharge status: Home / Self Care | Payer: Medicare Other | Source: Ambulatory Visit | Attending: Family Medicine | Admitting: Family Medicine

## 2022-05-18 DIAGNOSIS — E2839 Other primary ovarian failure: Secondary | ICD-10-CM

## 2022-05-18 DIAGNOSIS — Z1231 Encounter for screening mammogram for malignant neoplasm of breast: Secondary | ICD-10-CM | POA: Diagnosis not present

## 2022-05-18 DIAGNOSIS — Z780 Asymptomatic menopausal state: Secondary | ICD-10-CM | POA: Diagnosis not present

## 2022-05-21 ENCOUNTER — Other Ambulatory Visit: Payer: Self-pay | Admitting: Family Medicine

## 2022-05-21 DIAGNOSIS — R928 Other abnormal and inconclusive findings on diagnostic imaging of breast: Secondary | ICD-10-CM

## 2022-06-01 ENCOUNTER — Ambulatory Visit
Admission: RE | Admit: 2022-06-01 | Discharge: 2022-06-01 | Disposition: A | Discharge status: Home / Self Care | Payer: Medicare Other | Source: Ambulatory Visit | Attending: Family Medicine | Admitting: Family Medicine

## 2022-06-01 DIAGNOSIS — R928 Other abnormal and inconclusive findings on diagnostic imaging of breast: Secondary | ICD-10-CM | POA: Diagnosis not present

## 2022-06-01 DIAGNOSIS — R921 Mammographic calcification found on diagnostic imaging of breast: Secondary | ICD-10-CM | POA: Diagnosis not present

## 2022-06-02 ENCOUNTER — Other Ambulatory Visit: Payer: Self-pay | Admitting: Family Medicine

## 2022-06-02 DIAGNOSIS — R921 Mammographic calcification found on diagnostic imaging of breast: Secondary | ICD-10-CM

## 2022-06-09 ENCOUNTER — Ambulatory Visit
Admission: RE | Admit: 2022-06-09 | Discharge: 2022-06-09 | Disposition: A | Discharge status: Home / Self Care | Payer: Medicare Other | Source: Ambulatory Visit | Attending: Family Medicine | Admitting: Family Medicine

## 2022-06-09 DIAGNOSIS — R921 Mammographic calcification found on diagnostic imaging of breast: Secondary | ICD-10-CM

## 2022-06-09 DIAGNOSIS — N6489 Other specified disorders of breast: Secondary | ICD-10-CM | POA: Diagnosis not present

## 2022-06-09 HISTORY — PX: BREAST BIOPSY: SHX20

## 2022-08-10 DIAGNOSIS — L820 Inflamed seborrheic keratosis: Secondary | ICD-10-CM | POA: Diagnosis not present

## 2022-08-10 DIAGNOSIS — L920 Granuloma annulare: Secondary | ICD-10-CM | POA: Diagnosis not present

## 2023-06-21 DIAGNOSIS — H2513 Age-related nuclear cataract, bilateral: Secondary | ICD-10-CM | POA: Diagnosis not present

## 2023-07-08 ENCOUNTER — Other Ambulatory Visit: Payer: Self-pay | Admitting: Family Medicine

## 2023-07-08 DIAGNOSIS — Z1231 Encounter for screening mammogram for malignant neoplasm of breast: Secondary | ICD-10-CM

## 2023-07-12 ENCOUNTER — Ambulatory Visit
Admission: RE | Admit: 2023-07-12 | Discharge: 2023-07-12 | Disposition: A | Discharge status: Home / Self Care | Source: Ambulatory Visit | Attending: Family Medicine | Admitting: Family Medicine

## 2023-07-12 DIAGNOSIS — Z1231 Encounter for screening mammogram for malignant neoplasm of breast: Secondary | ICD-10-CM

## 2023-09-16 DIAGNOSIS — J019 Acute sinusitis, unspecified: Secondary | ICD-10-CM | POA: Diagnosis not present

## 2023-09-16 DIAGNOSIS — J209 Acute bronchitis, unspecified: Secondary | ICD-10-CM | POA: Diagnosis not present

## 2024-01-04 DIAGNOSIS — E785 Hyperlipidemia, unspecified: Secondary | ICD-10-CM | POA: Diagnosis not present

## 2024-01-04 DIAGNOSIS — Z1211 Encounter for screening for malignant neoplasm of colon: Secondary | ICD-10-CM | POA: Diagnosis not present

## 2024-01-04 DIAGNOSIS — E559 Vitamin D deficiency, unspecified: Secondary | ICD-10-CM | POA: Diagnosis not present

## 2024-01-04 DIAGNOSIS — R42 Dizziness and giddiness: Secondary | ICD-10-CM | POA: Diagnosis not present

## 2024-01-04 DIAGNOSIS — Z0000 Encounter for general adult medical examination without abnormal findings: Secondary | ICD-10-CM | POA: Diagnosis not present

## 2024-01-04 DIAGNOSIS — R7303 Prediabetes: Secondary | ICD-10-CM | POA: Diagnosis not present

## 2024-01-05 DIAGNOSIS — E785 Hyperlipidemia, unspecified: Secondary | ICD-10-CM | POA: Diagnosis not present

## 2024-01-05 DIAGNOSIS — Z1211 Encounter for screening for malignant neoplasm of colon: Secondary | ICD-10-CM | POA: Diagnosis not present

## 2024-01-10 DIAGNOSIS — B029 Zoster without complications: Secondary | ICD-10-CM | POA: Diagnosis not present

## 2024-01-10 DIAGNOSIS — S0086XA Insect bite (nonvenomous) of other part of head, initial encounter: Secondary | ICD-10-CM | POA: Diagnosis not present
